# Patient Record
Sex: Male | Born: 1995 | Race: Black or African American | Hispanic: No | Marital: Single | Smoking: Never smoker
Health system: Southern US, Community
[De-identification: ages and names within clinical notes are randomized; demographics above are authoritative.]

## PROBLEM LIST (undated history)

## (undated) DIAGNOSIS — J45909 Unspecified asthma, uncomplicated: Secondary | ICD-10-CM

---

## 2018-02-17 ENCOUNTER — Encounter (HOSPITAL_COMMUNITY): Payer: Self-pay | Admitting: *Deleted

## 2018-02-17 ENCOUNTER — Emergency Department (HOSPITAL_COMMUNITY)
Admission: EM | Admit: 2018-02-17 | Discharge: 2018-02-17 | Disposition: A | Discharge status: Home / Self Care | Payer: Self-pay | Attending: Emergency Medicine | Admitting: Emergency Medicine

## 2018-02-17 ENCOUNTER — Other Ambulatory Visit: Payer: Self-pay

## 2018-02-17 DIAGNOSIS — R252 Cramp and spasm: Secondary | ICD-10-CM | POA: Insufficient documentation

## 2018-02-17 DIAGNOSIS — M6282 Rhabdomyolysis: Secondary | ICD-10-CM | POA: Insufficient documentation

## 2018-02-17 HISTORY — DX: Unspecified asthma, uncomplicated: J45.909

## 2018-02-17 LAB — BASIC METABOLIC PANEL
Anion gap: 13 (ref 5–15)
BUN: 14 mg/dL (ref 6–20)
CHLORIDE: 105 mmol/L (ref 98–111)
CO2: 23 mmol/L (ref 22–32)
CREATININE: 1.7 mg/dL — AB (ref 0.61–1.24)
Calcium: 9.8 mg/dL (ref 8.9–10.3)
GFR calc Af Amer: >60 mL/min (ref >60)
GFR calc non Af Amer: 56 mL/min — ABNORMAL LOW (ref >60)
Glucose, Bld: 85 mg/dL (ref 70–99)
POTASSIUM: 3.9 mmol/L (ref 3.5–5.1)
Sodium: 141 mmol/L (ref 135–145)

## 2018-02-17 LAB — CBC
HCT: 45.3 % (ref 39.0–52.0)
HEMOGLOBIN: 14.8 g/dL (ref 13.0–17.0)
MCH: 29.5 pg (ref 26.0–34.0)
MCHC: 32.7 g/dL (ref 30.0–36.0)
MCV: 90.2 fL (ref 78.0–100.0)
PLATELETS: 215 10*3/uL (ref 150–400)
RBC: 5.02 MIL/uL (ref 4.22–5.81)
RDW: 14.4 % (ref 11.5–15.5)
WBC: 8 10*3/uL (ref 4.0–10.5)

## 2018-02-17 LAB — CK: Total CK: 1740 U/L — ABNORMAL HIGH (ref 49–397)

## 2018-02-17 MED ORDER — SODIUM CHLORIDE 0.9 % IV SOLN: 1000.0000 mL | INTRAVENOUS | Status: DC

## 2018-02-17 MED ORDER — SODIUM CHLORIDE 0.9 % IV BOLUS (SEPSIS)
1000.0000 mL | Freq: Once | INTRAVENOUS | Status: AC
  Administered 2018-02-17: 1000 mL via INTRAVENOUS

## 2018-02-17 NOTE — ED Provider Notes (Signed)
[facility]-EMERGENCY DEPT Provider Note   CSN: [id number] Arrival date & time: 02/17/18  1907     History   Chief Complaint Chief Complaint  Patient presents with  . Muscle Cramping    HPI Brian Palmer is a 22 y.o. male.  HPI She presented to the emergency room for evaluation of diffuse muscle cramping.  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  Patient was doing weight lifting as well as other aerobic activity.  Patient started having diffuse muscle cramps.  He was treated by the trainers and I tried to do cold compresses and massages.  He started to improve but then is cramping returned.  He was having abdominal as well as arm and leg cramps.  He was brought to the emergency room for further evaluation.  He denies any trouble with fevers or chills.  No vomiting or diarrhea.  As the patient has been waiting, symptoms have slowly improved. Past Medical History:  Diagnosis Date  . Asthma     There are no active problems to display for this patient.   History reviewed. No pertinent surgical history.      Home Medications    Prior to Admission medications   Medication Sig Start Date End Date Taking? Authorizing Provider  albuterol (PROVENTIL HFA;VENTOLIN HFA) 108 (90 Base) MCG/ACT inhaler Inhale 2 puffs into the lungs 4 (four) times daily as needed. 01/28/18  Yes [provider]  budesonide-formoterol (SYMBICORT) 160-4.5 MCG/ACT inhaler Inhale 2 puffs into the lungs 2 (two) times daily.   Yes [provider]    Family History No family history on file.  Social History Social History   Tobacco Use  . Smoking status: Never Smoker  . Smokeless tobacco: Never Used  Substance Use Topics  . Alcohol use: Yes  . Drug use: Never     Allergies   Patient has no known allergies.   Review of Systems Review of Systems  All other systems reviewed and are negative.    Physical Exam Updated  Vital Signs BP (!) 157/71 (BP Location: Right Arm)   Pulse 83   Temp 97.6 F (36.4 C) (Oral)   Resp 19   Ht 1.88 m (6\' 2" )   Wt 102.1 kg (225 lb)   SpO2 100%   BMI 28.89 kg/m   Physical Exam  Constitutional: He appears well-developed and well-nourished. No distress.  HENT:  Head: Normocephalic and atraumatic.  Right Ear: External ear normal.  Left Ear: External ear normal.  Eyes: Conjunctivae are normal. Right eye exhibits no discharge. Left eye exhibits no discharge. No scleral icterus.  Neck: Neck supple. No tracheal deviation present.  Cardiovascular: Normal rate, regular rhythm and intact distal pulses.  Pulmonary/Chest: Effort normal and breath sounds normal. No stridor. No respiratory distress. He has no wheezes. He has no rales.  Abdominal: Soft. Bowel sounds are normal. He exhibits no distension. There is no tenderness. There is no rebound and no guarding.  Musculoskeletal: He exhibits no edema or tenderness.  Neurological: He is alert. He has normal strength. No cranial nerve deficit (no facial droop, extraocular movements intact, no slurred speech) or sensory deficit. He exhibits normal muscle tone. He displays no seizure activity. Coordination normal.  Skin: Skin is warm and dry. No rash noted.  Psychiatric: He has a normal mood and affect.  Nursing note and vitals reviewed.    ED Treatments / Results  Labs (all labs ordered are listed, but only  abnormal results are displayed) Labs Reviewed  BASIC METABOLIC PANEL - Abnormal; Notable for the following components:      Result Value   Creatinine, Ser 1.70 (*)    GFR calc non Af Amer 56 (*)    All other components within normal limits  CK - Abnormal; Notable for the following components:   Total CK 1,740 (*)    All other components within normal limits  CBC    EKG None  Radiology No results found.  Procedures Procedures (including critical care time)  Medications Ordered in ED Medications  sodium  chloride 0.9 % bolus 1,000 mL (1,000 mLs Intravenous New Bag/Given 02/17/18 2148)    Followed by  sodium chloride 0.9 % bolus 1,000 mL (1,000 mLs Intravenous New Bag/Given 02/17/18 2148)    Followed by  0.9 %  sodium chloride infusion (has no administration in time range)     Initial Impression / Assessment and Plan / ED Course  I have reviewed the triage vital signs and the nursing notes.  Pertinent labs & imaging results that were available during my care of the patient were reviewed by me and considered in my medical decision making (see chart for details).  Clinical Course as of Feb 18 2207  Caleen EssexFri Feb 17, 2018  2207 Labs are notable for mild increase in his creatinine as well as his CK.  2 L of IV fluids have been ordered   [JK]    Clinical Course User Index [JK] Linwood DibblesKnapp, Doyne Micke, MD    She presented to the emergency room for evaluation of muscle cramps after strenuous exertion.  Patient's laboratory tests were notable for an increased creatinine as well as mildly elevated creatinine kinase.  No signs of severe rhabdo.  Patient symptoms had resolved.  He is tolerating fluids.  Patient was hydrated with 2 L of IV fluids.  Stable for outpatient follow-up.  Recommended repeat creatinine kinase levels and outpatient  Final Clinical Impressions(s) / ED Diagnoses   Final diagnoses:  Muscle cramp  Non-traumatic rhabdomyolysis    ED Discharge Orders    None       Linwood DibblesKnapp, Claryce Friel, MD 02/17/18 2257

## 2018-02-17 NOTE — ED Triage Notes (Signed)
Pt arrives via EMS, per their report,  from football practice when he had left abdominal cramping that radiated to bilateral arms. 30 minutes of cold compresses and massage by athletic trainers. On arrival by EMS, initial body temp was 92 degrees orally, BP 140/82, hr 70, r 18, cbg 116, IV established in the Right AC (#18), NS given.

## 2018-02-17 NOTE — ED Triage Notes (Signed)
Pt states that he was at football practice this afternoon and had onset of lower abdominal cramping that dissipated, then he started having some cramping in his arms and calves. This was the third practice of the day to which this time he had been practicing about 2 hours prior to onset of symptoms. Athletic trainers put cool compresses on him and poured cold water on him, he says for about 10 minutes. Temp 94 in triage, warm blankets given.

## 2018-02-17 NOTE — ED Notes (Signed)
ED Provider at bedside. 

## 2018-02-17 NOTE — Discharge Instructions (Signed)
Drink plenty of fluids, follow-up with primary care doctor to have your creatinine level rechecked in the next week or 2

## 2018-06-01 ENCOUNTER — Other Ambulatory Visit: Payer: Self-pay | Admitting: Family Medicine

## 2018-06-01 DIAGNOSIS — S060X9A Concussion with loss of consciousness of unspecified duration, initial encounter: Secondary | ICD-10-CM

## 2018-06-02 ENCOUNTER — Other Ambulatory Visit: Payer: Self-pay

## 2018-06-05 ENCOUNTER — Ambulatory Visit
Admission: RE | Admit: 2018-06-05 | Discharge: 2018-06-05 | Disposition: A | Discharge status: Home / Self Care | Payer: BLUE CROSS/BLUE SHIELD | Source: Ambulatory Visit | Attending: Family Medicine | Admitting: Family Medicine

## 2018-06-05 DIAGNOSIS — S060X9A Concussion with loss of consciousness of unspecified duration, initial encounter: Secondary | ICD-10-CM

## 2019-03-03 ENCOUNTER — Other Ambulatory Visit: Payer: Self-pay

## 2019-03-03 DIAGNOSIS — Z20822 Contact with and (suspected) exposure to covid-19: Secondary | ICD-10-CM

## 2019-03-04 LAB — NOVEL CORONAVIRUS, NAA: SARS-CoV-2, NAA: NOT DETECTED

## 2019-08-22 IMAGING — CT CT HEAD W/O CM
1 series · 15 of 30 positions shown, 19 images · non-contrast
Comparison: None.

CLINICAL DATA: Concussive injury while playing football. Blurred
vision. History of headache from previous concussive episodes

EXAM:
CT HEAD WITHOUT CONTRAST
TECHNIQUE: Contiguous axial images were obtained from the base of the skull
through the vertex without intravenous contrast.

[Series 2: head w/(date) · axial · 0.46mm/px · z∈[-168,-3]mm · 15 of 37 slices shown, 19 images]
[im 2/37  brain]
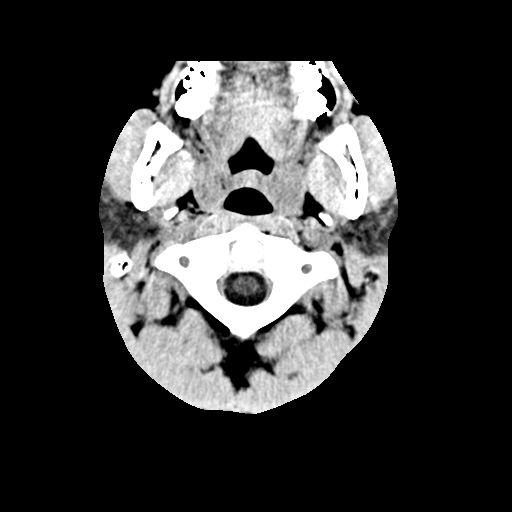
[im 2/37  bone]
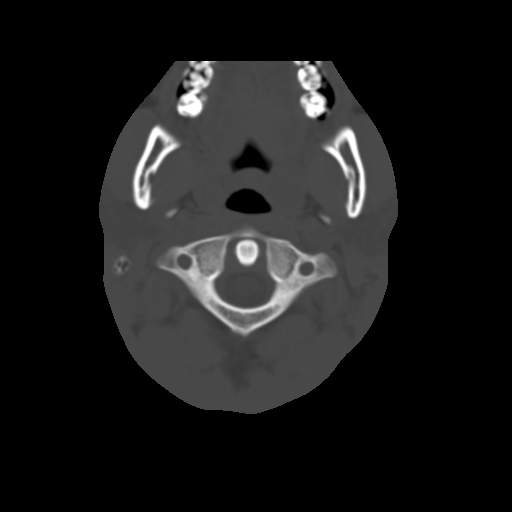
[im 4/37  brain]
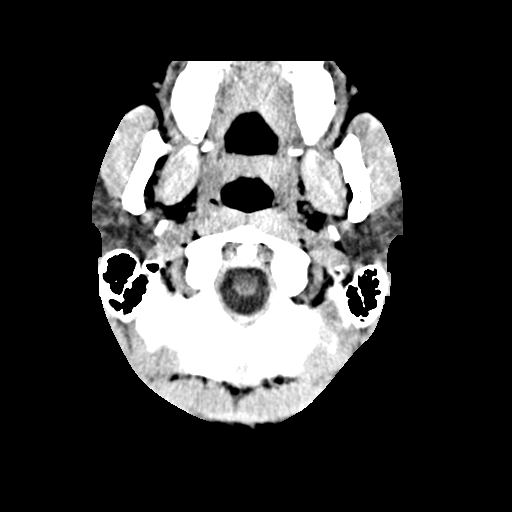
[im 7/37  brain]
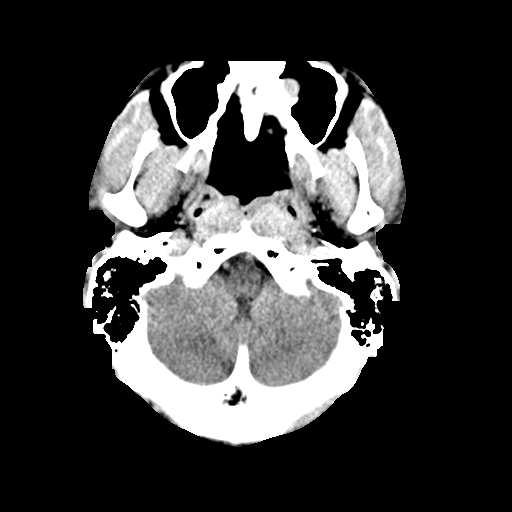
[im 9/37  brain]
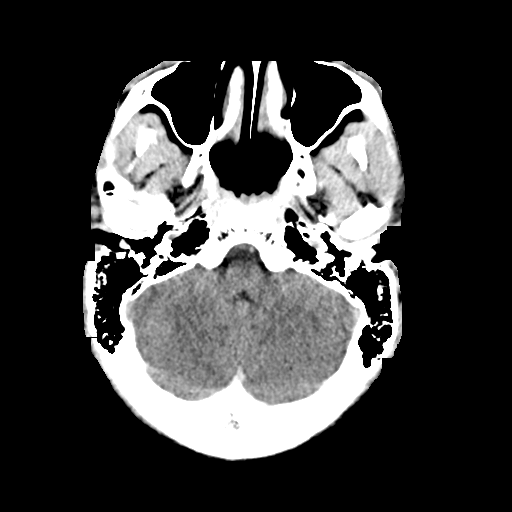
[im 12/37  brain]
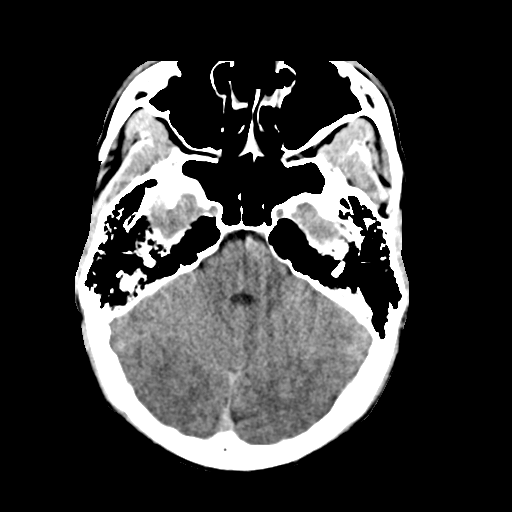
[im 12/37  bone]
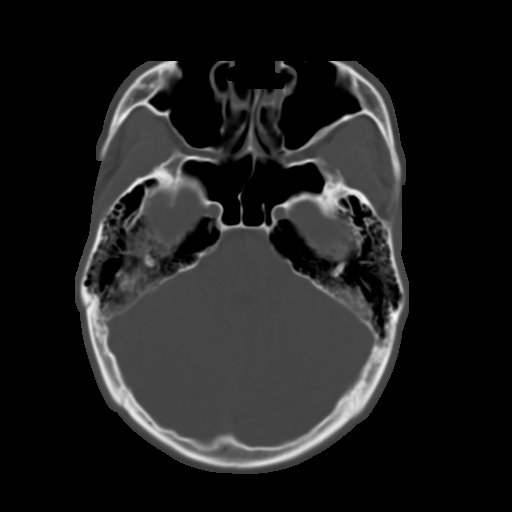
[im 14/37  brain]
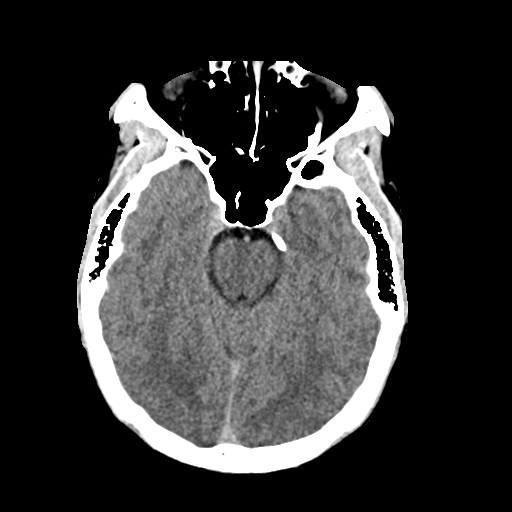
[im 17/37  brain]
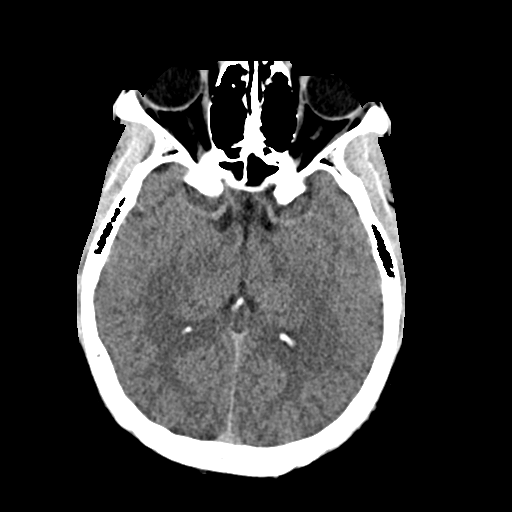
[im 19/37  brain]
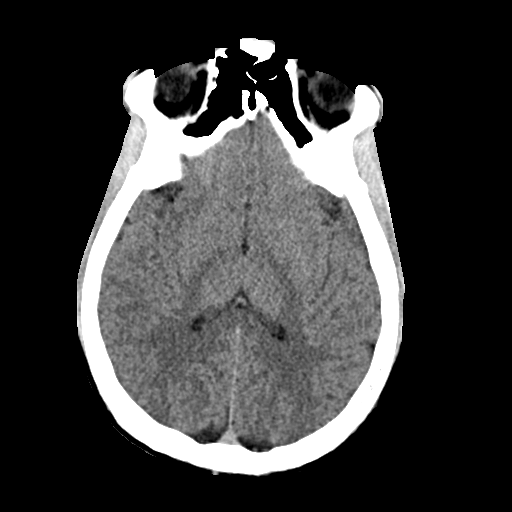
[im 20/37  brain]
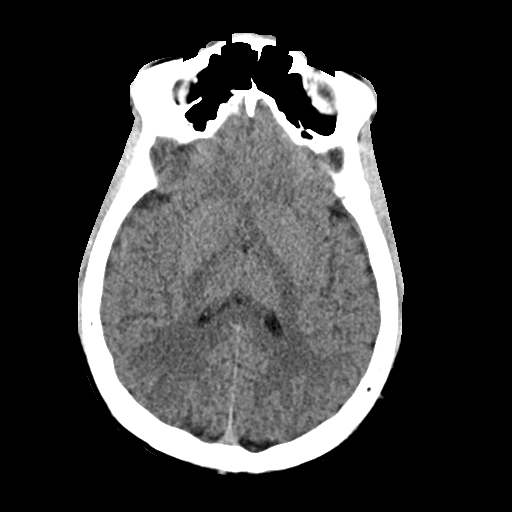
[im 20/37  bone]
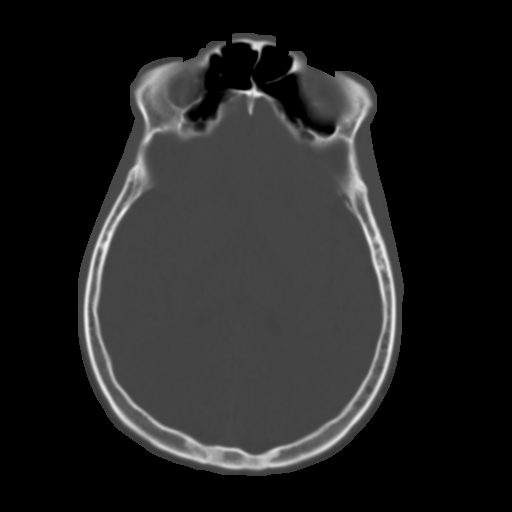
[im 23/37  brain]
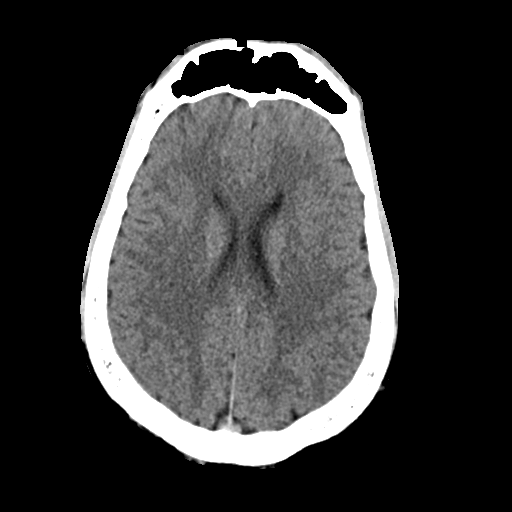
[im 25/37  brain]
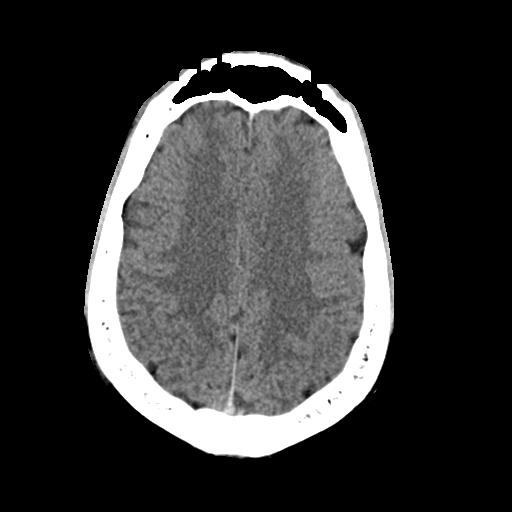
[im 28/37  brain]
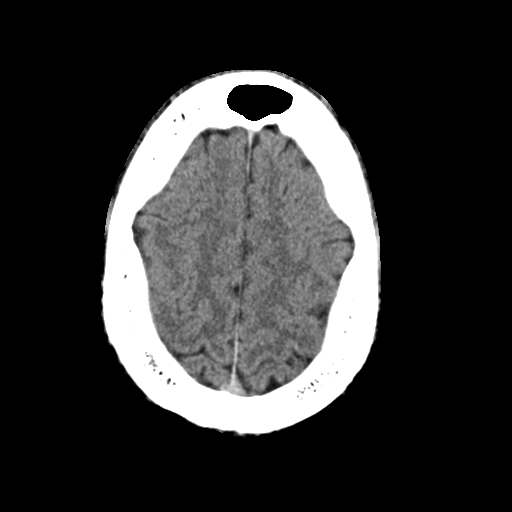
[im 30/37  brain]
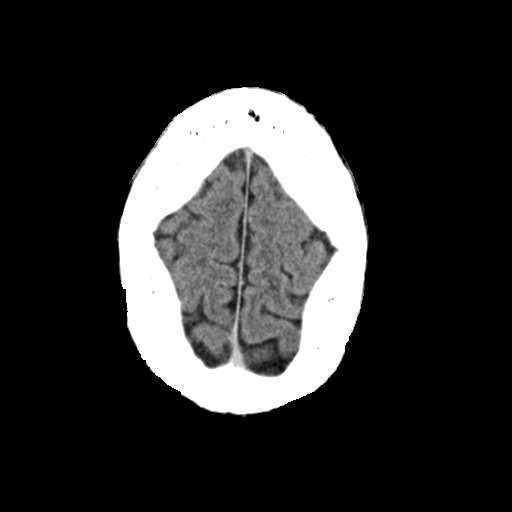
[im 30/37  bone]
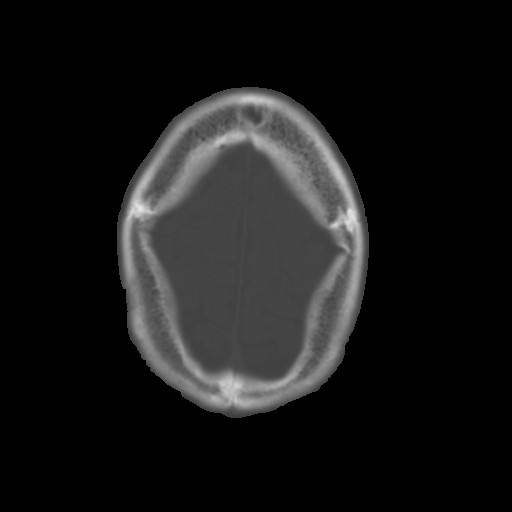
[im 33/37  brain]
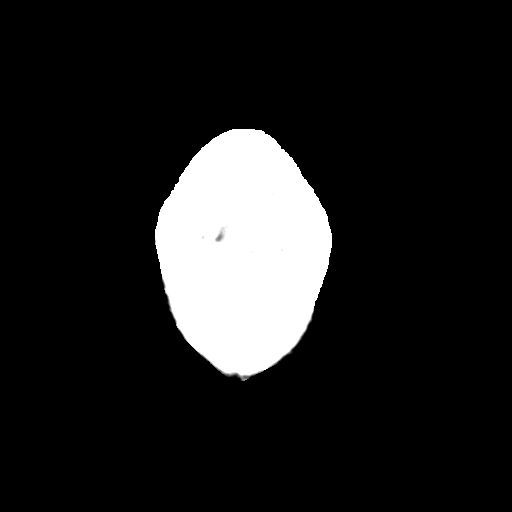
[im 35/37  brain]
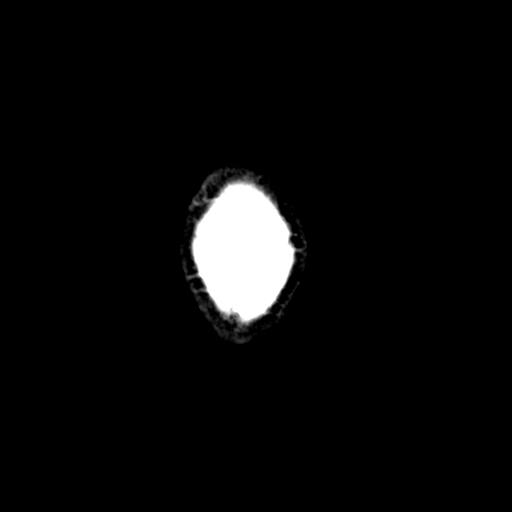

[15 of 30 positions shown; findings below may reference images not displayed]

FINDINGS: Brain: Ventricles are normal in size and configuration. There is no
intracranial mass, hemorrhage, extra-axial fluid collection, or
midline shift. Brain parenchyma appears unremarkable. No evident
acute infarct.

Vascular: No hyperdense vessel. No appreciable vascular
calcification evident.

Skull: The bony calvarium appears intact.

Sinuses/Orbits: There is mucosal thick with several small retention
cysts in the maxillary antra bilaterally. There is mucosal
thickening in several ethmoid air cells. Other paranasal sinuses are
clear. Orbits appear symmetric bilaterally.

Other: Mastoid air cells are clear. There is debris in each external
auditory canal.
IMPRESSION: Right brain parenchyma appears unremarkable. No acute infarct. No
mass or hemorrhage.

Foci of paranasal sinus disease noted. Probable cerumen in each
external auditory canal.

## 2019-12-25 ENCOUNTER — Ambulatory Visit: Payer: BLUE CROSS/BLUE SHIELD
